# Patient Record
Sex: Male | Born: 1970
Health system: Western US, Academic
[De-identification: ages and names within clinical notes are randomized; demographics above are authoritative.]

## PROBLEM LIST (undated history)

## (undated) DIAGNOSIS — E271 Primary adrenocortical insufficiency: Secondary | ICD-10-CM

## (undated) DIAGNOSIS — N289 Disorder of kidney and ureter, unspecified: Secondary | ICD-10-CM

## (undated) HISTORY — PX: OTHER SURGICAL HISTORY: SHX169

## (undated) HISTORY — PX: HERNIA REPAIR: SHX51

## (undated) HISTORY — PX: CHOLECYSTECTOMY: SHX55

---

## 1999-06-06 ENCOUNTER — Emergency Department (HOSPITAL_COMMUNITY): Admission: EM | Admit: 1999-06-06 | Discharge: 1999-06-06 | Payer: Self-pay | Admitting: Emergency Medicine

## 2014-10-19 ENCOUNTER — Emergency Department: Payer: Self-pay | Admitting: Emergency Medicine

## 2014-12-26 ENCOUNTER — Emergency Department (HOSPITAL_BASED_OUTPATIENT_CLINIC_OR_DEPARTMENT_OTHER)
Admission: EM | Admit: 2014-12-26 | Discharge: 2014-12-27 | Disposition: A | Discharge status: Home / Self Care | Payer: Self-pay | Attending: Emergency Medicine | Admitting: Emergency Medicine

## 2014-12-26 ENCOUNTER — Encounter (HOSPITAL_BASED_OUTPATIENT_CLINIC_OR_DEPARTMENT_OTHER): Payer: Self-pay | Admitting: *Deleted

## 2014-12-26 DIAGNOSIS — Z720 Tobacco use: Secondary | ICD-10-CM | POA: Insufficient documentation

## 2014-12-26 DIAGNOSIS — R109 Unspecified abdominal pain: Secondary | ICD-10-CM

## 2014-12-26 DIAGNOSIS — Z8639 Personal history of other endocrine, nutritional and metabolic disease: Secondary | ICD-10-CM | POA: Insufficient documentation

## 2014-12-26 DIAGNOSIS — Z7952 Long term (current) use of systemic steroids: Secondary | ICD-10-CM | POA: Insufficient documentation

## 2014-12-26 DIAGNOSIS — Z9049 Acquired absence of other specified parts of digestive tract: Secondary | ICD-10-CM | POA: Insufficient documentation

## 2014-12-26 DIAGNOSIS — R1012 Left upper quadrant pain: Secondary | ICD-10-CM | POA: Insufficient documentation

## 2014-12-26 DIAGNOSIS — Z87448 Personal history of other diseases of urinary system: Secondary | ICD-10-CM | POA: Insufficient documentation

## 2014-12-26 DIAGNOSIS — R1032 Left lower quadrant pain: Secondary | ICD-10-CM | POA: Insufficient documentation

## 2014-12-26 HISTORY — DX: Disorder of kidney and ureter, unspecified: N28.9

## 2014-12-26 HISTORY — DX: Primary adrenocortical insufficiency: E27.1

## 2014-12-26 LAB — URINALYSIS, ROUTINE W REFLEX MICROSCOPIC
Bilirubin Urine: NEGATIVE
GLUCOSE, UA: NEGATIVE mg/dL
Ketones, ur: NEGATIVE mg/dL
Nitrite: NEGATIVE
Protein, ur: NEGATIVE mg/dL
Specific Gravity, Urine: 1.008 (ref 1.005–1.030)
UROBILINOGEN UA: 0.2 mg/dL (ref 0.0–1.0)
pH: 6 (ref 5.0–8.0)

## 2014-12-26 LAB — URINE MICROSCOPIC-ADD ON

## 2014-12-26 NOTE — ED Notes (Signed)
Pt was seen in [place] 2 days ago for flank and abd pain and dx with kidney stone, hernia and varicocele. Pt is on his way home to [place] and the pain is unbearable and pt has been vomiting.

## 2014-12-27 ENCOUNTER — Emergency Department (HOSPITAL_BASED_OUTPATIENT_CLINIC_OR_DEPARTMENT_OTHER): Payer: Self-pay

## 2014-12-27 LAB — CBC WITH DIFFERENTIAL/PLATELET
Basophils Absolute: 0.1 10*3/uL (ref 0.0–0.1)
Basophils Relative: 1 % (ref 0–1)
EOS ABS: 0.8 10*3/uL — AB (ref 0.0–0.7)
Eosinophils Relative: 9 % — ABNORMAL HIGH (ref 0–5)
HEMATOCRIT: 44.6 % (ref 39.0–52.0)
Hemoglobin: 16 g/dL (ref 13.0–17.0)
LYMPHS ABS: 1.9 10*3/uL (ref 0.7–4.0)
Lymphocytes Relative: 21 % (ref 12–46)
MCH: 32.9 pg (ref 26.0–34.0)
MCHC: 35.9 g/dL (ref 30.0–36.0)
MCV: 91.6 fL (ref 78.0–100.0)
MONO ABS: 0.7 10*3/uL (ref 0.1–1.0)
Monocytes Relative: 8 % (ref 3–12)
NEUTROS PCT: 61 % (ref 43–77)
Neutro Abs: 5.7 10*3/uL (ref 1.7–7.7)
Platelets: 235 10*3/uL (ref 150–400)
RBC: 4.87 MIL/uL (ref 4.22–5.81)
RDW: 11.9 % (ref 11.5–15.5)
WBC: 9.3 10*3/uL (ref 4.0–10.5)

## 2014-12-27 LAB — BASIC METABOLIC PANEL
Anion gap: 11 (ref 5–15)
BUN: 15 mg/dL (ref 6–20)
CALCIUM: 9.6 mg/dL (ref 8.9–10.3)
CO2: 25 mmol/L (ref 22–32)
Chloride: 104 mmol/L (ref 101–111)
Creatinine, Ser: 0.91 mg/dL (ref 0.61–1.24)
GFR calc Af Amer: >60 mL/min (ref >60)
Glucose, Bld: 91 mg/dL (ref 65–99)
POTASSIUM: 3.9 mmol/L (ref 3.5–5.1)
Sodium: 140 mmol/L (ref 135–145)

## 2014-12-27 MED ORDER — OXYCODONE-ACETAMINOPHEN 5-325 MG PO TABS
2.0000 | ORAL_TABLET | Freq: Once | ORAL | Status: AC
  Administered 2014-12-27: 2 via ORAL
  Filled 2014-12-27: qty 2

## 2014-12-27 NOTE — ED Provider Notes (Signed)
CSN: [id number]     Arrival date & time 12/26/14  2320 History  This chart was scribed for George MoMatthew Gentry, MD by George Mccoy, ED Scribe. This patient was seen in room MH10/MH10 and the patient's care was started 12:02 AM.     Chief Complaint  Patient presents with  . Groin Pain   Patient is a 44 y.o. male presenting with groin pain. The history is provided by the patient.  Groin Pain This is a recurrent problem. The current episode started more than 2 days ago. The problem occurs constantly. The problem has been gradually worsening. Associated symptoms include abdominal pain. Nothing aggravates the symptoms. The symptoms are relieved by medications. The treatment provided mild relief.   HPI Comments: George Mccoy is a 44 y.o. male who presents to the Emergency Department complaining of groin pain from his dx kidney stones 4 days ago. Pain complains of sharp, stabbing pain on his left flank that radiates to his testicle. Pt was seen in [place] 4 days ago for similar symptoms and was dx with kidney stone, hernia and varicocele. Pt reports that the medications he was prescribed (flomax, hydrocodone) has provided him some relief.  He states that he was on his way home today when he began vomiting due to pain.    Past Medical History  Diagnosis Date  . Renal disorder   . Addison's disease    Past Surgical History  Procedure Laterality Date  . Kidney stone stints    . Cholecystectomy    . Hernia repair     No family history on file. History  Substance Use Topics  . Smoking status: Current Every Day Smoker  . Smokeless tobacco: Not on file  . Alcohol Use: No    Review of Systems  Gastrointestinal: Positive for abdominal pain.  Genitourinary: Positive for flank pain and testicular pain.  All other systems reviewed and are negative.     Allergies  Erythromycin; Nsaids; and Morphine and related  Home Medications   Prior to Admission medications   Medication Sig Start  Date End Date Taking? Authorizing Provider  HYDROcodone-acetaminophen (NORCO/VICODIN) 5-325 MG per tablet Take 1 tablet by mouth every 6 (six) hours as needed for moderate pain.   Yes Historical Provider, MD  hydrocortisone (CORTEF) 20 MG tablet Take 20 mg by mouth daily.   Yes Historical Provider, MD  tamsulosin (FLOMAX) 0.4 MG CAPS capsule Take 0.4 mg by mouth.   Yes Historical Provider, MD   BP 148/76 mmHg  Pulse 88  Temp(Src) 98.3 F (36.8 C) (Oral)  Resp 18  Ht 6\' 2"  (1.88 m)  Wt 215 lb (97.523 kg)  BMI 27.59 kg/m2  SpO2 100% Physical Exam  Constitutional: He is oriented to person, place, and time. He appears well-developed and well-nourished.  HENT:  Head: Normocephalic and atraumatic.  Eyes: Conjunctivae and EOM are normal.  Neck: Normal range of motion. Neck supple.  Cardiovascular: Normal rate, regular rhythm and normal heart sounds.   Pulmonary/Chest: Effort normal and breath sounds normal. No respiratory distress.  Abdominal: He exhibits no distension. There is tenderness in the left upper quadrant and left lower quadrant. There is no rebound and no guarding.  Musculoskeletal: Normal range of motion.  Neurological: He is alert and oriented to person, place, and time.  Skin: Skin is warm and dry.  Vitals reviewed.   ED Course  Procedures  DIAGNOSTIC STUDIES: Oxygen Saturation is 98% on RA, normal by my interpretation.    COORDINATION OF CARE:  12:05 AM Discussed treatment plan with pt at bedside and pt agreed to plan.   Labs Review Labs Reviewed  URINALYSIS, ROUTINE W REFLEX MICROSCOPIC (NOT AT [facility]) - Abnormal; Notable for the following:    APPearance CLOUDY (*)    Hgb urine dipstick LARGE (*)    Leukocytes, UA SMALL (*)    All other components within normal limits  URINE MICROSCOPIC-ADD ON - Abnormal; Notable for the following:    Squamous Epithelial / LPF FEW (*)    All other components within normal limits  CBC WITH DIFFERENTIAL/PLATELET - Abnormal;  Notable for the following:    Eosinophils Relative 9 (*)    Eosinophils Absolute 0.8 (*)    All other components within normal limits  BASIC METABOLIC PANEL    Imaging Review US Renal  12/27/2014   CLINICAL DATA:  Severe left lower back pain. History of kidney stones.  EXAM: RENAL / URINARY TRACT ULTRASOUND COMPLETE  COMPARISON:  None.  FINDINGS: Right Kidney:  Normal cortical thickness, echogenicity and size, measuring 12.8 cm in length. No focal renal lesions. No echogenic renal stones. No urinary obstruction.  Left Kidney:  Normal cortical thickness, echogenicity and size, measuring 13.1 cm in length. No focal renal lesions. Note is made of a approximately 0.5 cm echogenic stone within the inferior pole the left kidney (image 17) as well as an approximately 0.4 cm stone also within the inferior pole the left kidney (image 18). No urinary obstruction.  Bladder:  Appears normal for degree of bladder distention. Bilateral ureteral jets are identified  IMPRESSION: 1. Suspected left-sided nephrolithiasis without evidence of urinary obstruction. Further evaluation with noncontrast abdominal CT could be performed as clinically indicated. 2. No evidence of urinary obstruction   Electronically Signed   By: Simonne Come M.D.   On: 12/27/2014 01:08     EKG Interpretation None      MDM   Final diagnoses:  Left flank pain    44 y.o. male with pertinent PMH of frequent ED visits to multiple providers in different states and across this state with suspicion of drug seeking (I verified this across [facility] provider database and with review of records through care everywhere), as well as verified nephrolithiasis presents with recurrent L flank pain.  No systemic symptoms with exception of nausea.  Physical exam today as above, pt well appearing with reassuring vitals.  Wu with hematuria, obtained US due to reported history of prior stenting.  [place] without hydronephrosis.    I discussed the pt's history and he was  very pleasant.  I discussed that I could not prescribe him medication due to his history and encouraged him to fu with his PCP and urology, as well as with his endocrinologist to see if he could receive intermittent toradol.  He voiced understanding of precautions and agreed to fu.  DC home in stable condition.    I have reviewed all laboratory and imaging studies if ordered as above  1. Left flank pain           George Mo, MD 12/27/14 [phone]

## 2014-12-27 NOTE — Discharge Instructions (Signed)
Flank Pain °Flank pain refers to pain that is located on the side of the body between the upper abdomen and the back. The pain may occur over a short period of time (acute) or may be long-term or reoccurring (chronic). It may be mild or severe. Flank pain can be caused by many things. °CAUSES  °Some of the more common causes of flank pain include: °· Muscle strains.   °· Muscle spasms.   °· A disease of your spine (vertebral disk disease).   °· A lung infection (pneumonia).   °· Fluid around your lungs (pulmonary edema).   °· A kidney infection.   °· Kidney stones.   °· A very painful skin rash caused by the chickenpox virus (shingles).   °· Gallbladder disease.   °HOME CARE INSTRUCTIONS  °Home care will depend on the cause of your pain. In general, °· Rest as directed by your caregiver. °· Drink enough fluids to keep your urine clear or pale yellow. °· Only take over-the-counter or prescription medicines as directed by your caregiver. Some medicines may help relieve the pain. °· Tell your caregiver about any changes in your pain. °· Follow up with your caregiver as directed. °SEEK IMMEDIATE MEDICAL CARE IF:  °· Your pain is not controlled with medicine.   °· You have new or worsening symptoms. °· Your pain increases.   °· You have abdominal pain.   °· You have shortness of breath.   °· You have persistent nausea or vomiting.   °· You have swelling in your abdomen.   °· You feel faint or pass out.   °· You have blood in your urine. °· You have a fever or persistent symptoms for more than 2-3 days. °· You have a fever and your symptoms suddenly get worse. °MAKE SURE YOU:  °· Understand these instructions. °· Will watch your condition. °· Will get help right away if you are not doing well or get worse. °Document Released: 09/10/2005 Document Revised: 04/13/2012 Document Reviewed: 03/03/2012 °ExitCare® Patient Information ©2015 [employer]. This information is not intended to replace advice given to you by your  health care provider. Make sure you discuss any questions you have with your health care provider. ° °Kidney Stones °Kidney stones (urolithiasis) are deposits that form inside your kidneys. The intense pain is caused by the stone moving through the urinary tract. When the stone moves, the ureter goes into spasm around the stone. The stone is usually passed in the urine.  °CAUSES  °· A disorder that makes certain neck glands produce too much parathyroid hormone (primary hyperparathyroidism). °· A buildup of uric acid crystals, similar to gout in your joints. °· Narrowing (stricture) of the ureter. °· A kidney obstruction present at birth (congenital obstruction). °· Previous surgery on the kidney or ureters. °· Numerous kidney infections. °SYMPTOMS  °· Feeling sick to your stomach (nauseous). °· Throwing up (vomiting). °· Blood in the urine (hematuria). °· Pain that usually spreads (radiates) to the groin. °· Frequency or urgency of urination. °DIAGNOSIS  °· Taking a history and physical exam. °· Blood or urine tests. °· CT scan. °· Occasionally, an examination of the inside of the urinary bladder (cystoscopy) is performed. °TREATMENT  °· Observation. °· Increasing your fluid intake. °· Extracorporeal shock wave lithotripsy--This is a noninvasive procedure that uses shock waves to break up kidney stones. °· Surgery may be needed if you have severe pain or persistent obstruction. There are various surgical procedures. Most of the procedures are performed with the use of small instruments. Only small incisions are needed to accommodate these instruments,   so recovery time is minimized. °The size, location, and chemical composition are all important variables that will determine the proper choice of action for you. Talk to your health care provider to better understand your situation so that you will minimize the risk of injury to yourself and your kidney.  °HOME CARE INSTRUCTIONS  °· Drink enough water and fluids to  keep your urine clear or pale yellow. This will help you to pass the stone or stone fragments. °· Strain all urine through the provided strainer. Keep all particulate matter and stones for your health care provider to see. The stone causing the pain may be as small as a grain of salt. It is very important to use the strainer each and every time you pass your urine. The collection of your stone will allow your health care provider to analyze it and verify that a stone has actually passed. The stone analysis will often identify what you can do to reduce the incidence of recurrences. °· Only take over-the-counter or prescription medicines for pain, discomfort, or fever as directed by your health care provider. °· Make a follow-up appointment with your health care provider as directed. °· Get follow-up X-rays if required. The absence of pain does not always mean that the stone has passed. It may have only stopped moving. If the urine remains completely obstructed, it can cause loss of kidney function or even complete destruction of the kidney. It is your responsibility to make sure X-rays and follow-ups are completed. Ultrasounds of the kidney can show blockages and the status of the kidney. Ultrasounds are not associated with any radiation and can be performed easily in a matter of minutes. °SEEK MEDICAL CARE IF: °· You experience pain that is progressive and unresponsive to any pain medicine you have been prescribed. °SEEK IMMEDIATE MEDICAL CARE IF:  °· Pain cannot be controlled with the prescribed medicine. °· You have a fever or shaking chills. °· The severity or intensity of pain increases over 18 hours and is not relieved by pain medicine. °· You develop a new onset of abdominal pain. °· You feel faint or pass out. °· You are unable to urinate. °MAKE SURE YOU:  °· Understand these instructions. °· Will watch your condition. °· Will get help right away if you are not doing well or get worse. °Document Released:  07/20/2005 Document Revised: 03/22/2013 Document Reviewed: 12/21/2012 °ExitCare® Patient Information ©2015 [employer]. This information is not intended to replace advice given to you by your health care provider. Make sure you discuss any questions you have with your health care provider. ° °

## 2014-12-27 NOTE — ED Notes (Signed)
Patient transported to Ultrasound 

## 2016-03-09 IMAGING — CT CT ABD-PELV W/O CM
2 of 4 series · 16 of 46 positions shown, 18 images · non-contrast
Comparison: None

CLINICAL DATA: Right-sided flank pain

EXAM:
CT ABDOMEN AND PELVIS WITHOUT CONTRAST
TECHNIQUE: Multidetector CT imaging of the abdomen and pelvis was performed
following the standard protocol without IV contrast.

[Series 2: stone standard full · axial · 0.81mm/px · z∈[-1140,-614]mm · 13 of 115 slices shown, 15 images]
[im 5/115  soft-tissue]
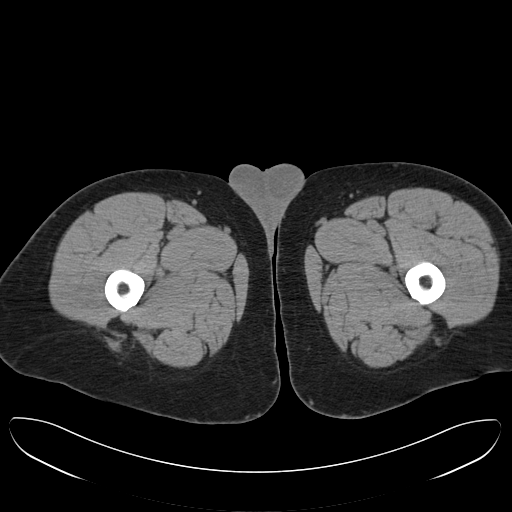
[im 5/115  bone]
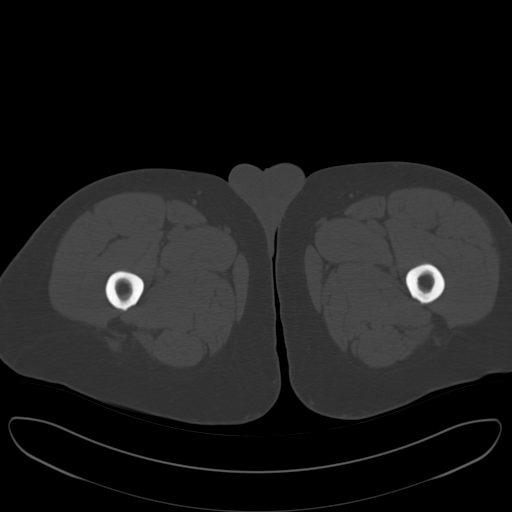
[im 14/115  soft-tissue]
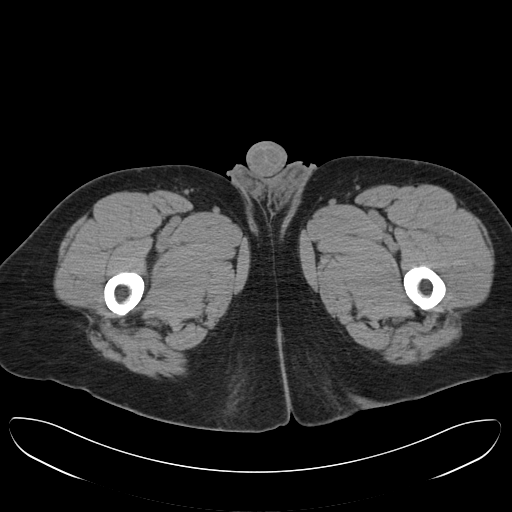
[im 22/115  soft-tissue]
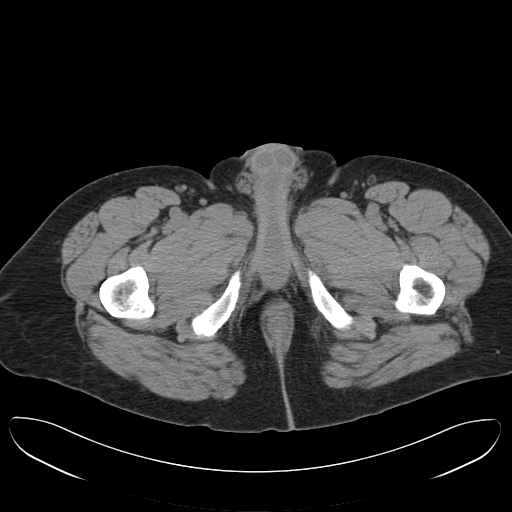
[im 31/115  soft-tissue]
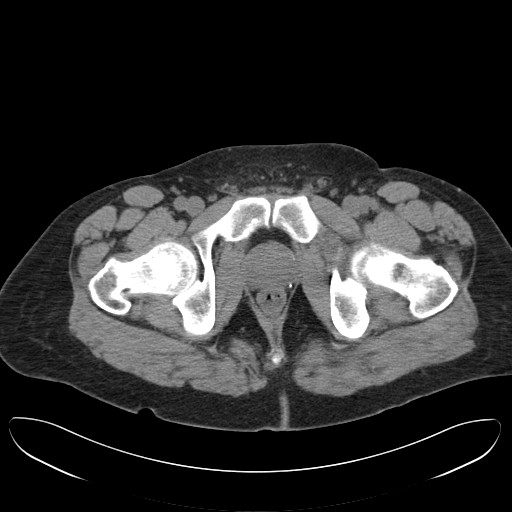
[im 40/115  soft-tissue]
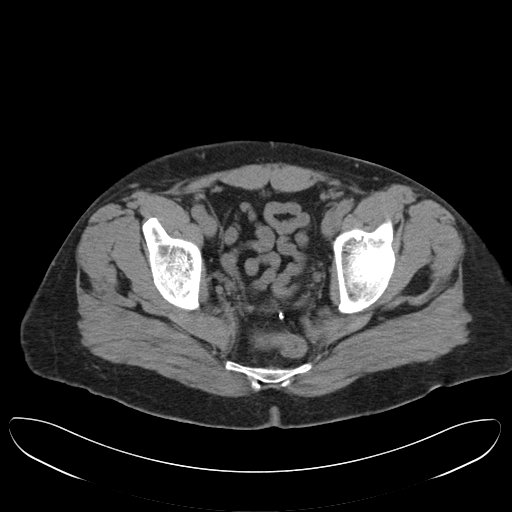
[im 49/115  soft-tissue]
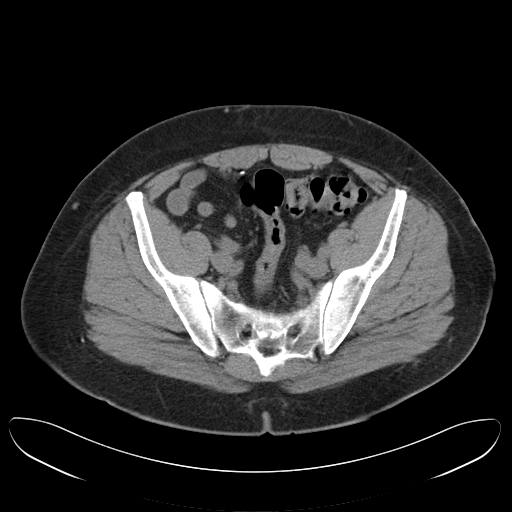
[im 58/115  soft-tissue]
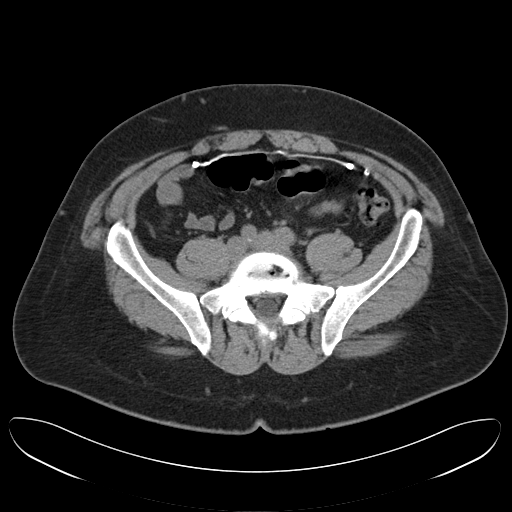
[im 66/115  soft-tissue]
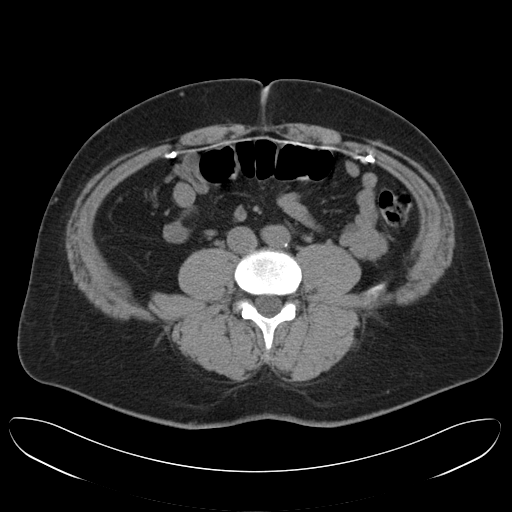
[im 75/115  soft-tissue]
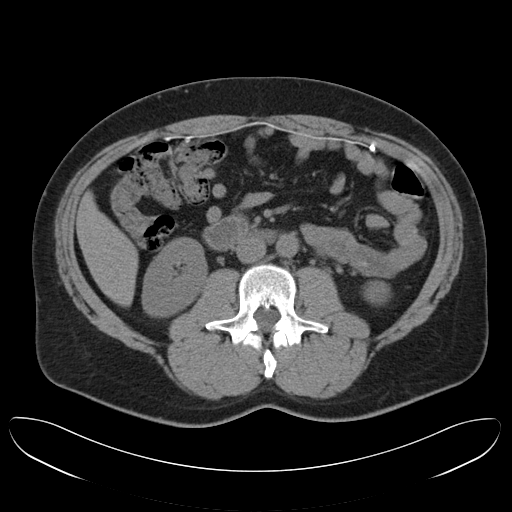
[im 75/115  bone]
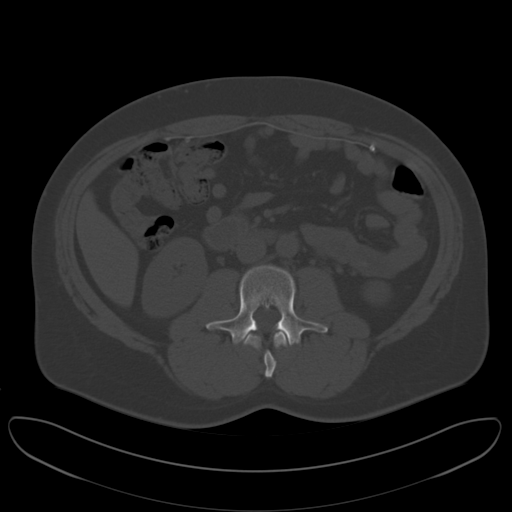
[im 84/115  soft-tissue]
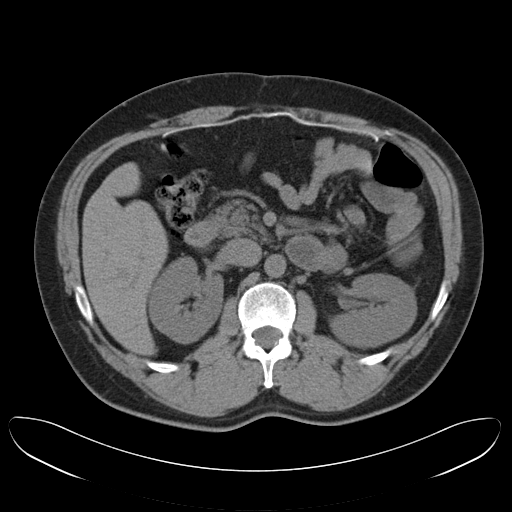
[im 93/115  soft-tissue]
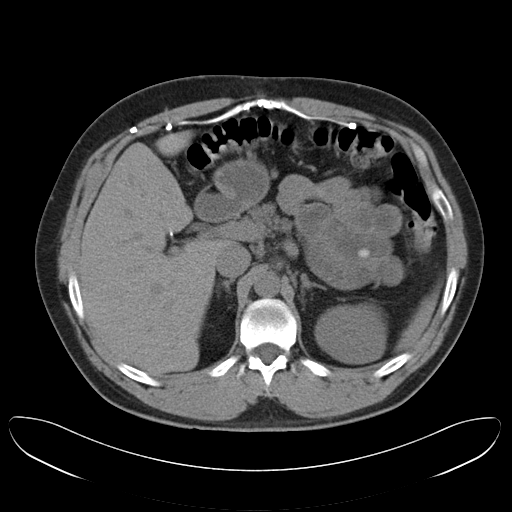
[im 101/115  soft-tissue]
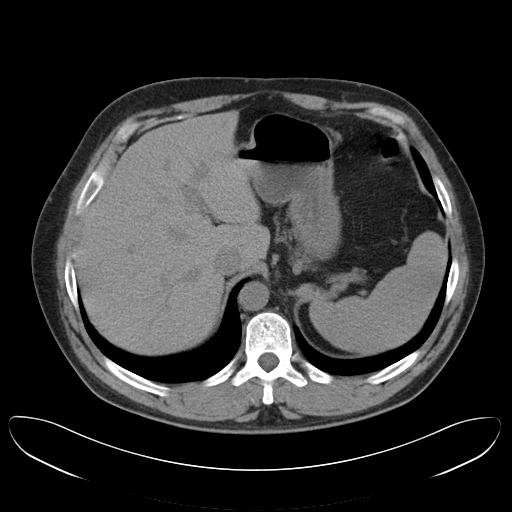
[im 110/115  soft-tissue]
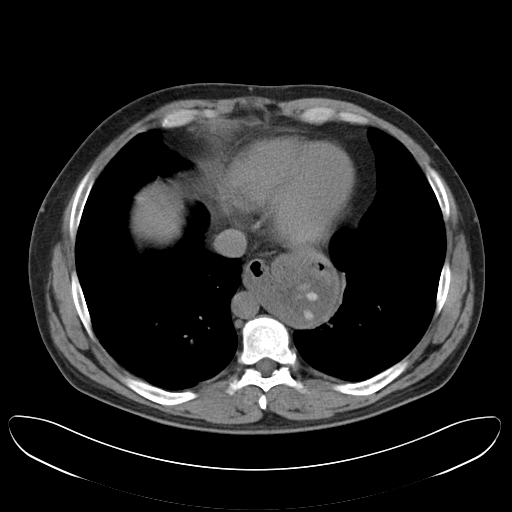

[Series 5: cor stone standard full · coronal · 0.83mm/px · 3 of 134 slices shown]
[im 45/134  soft-tissue]
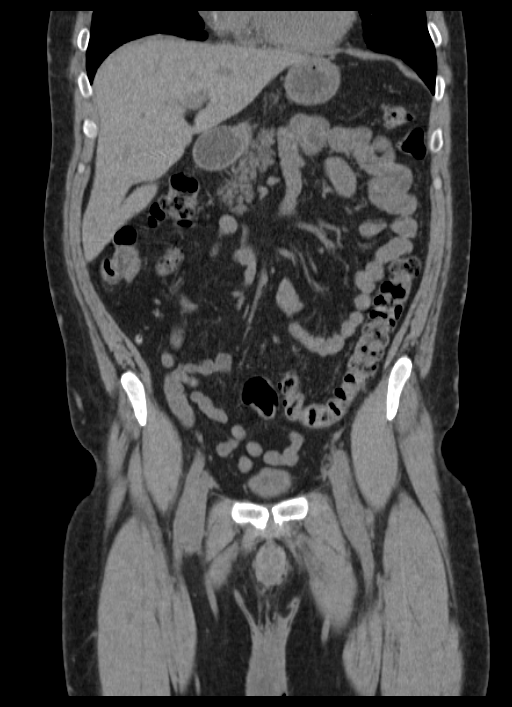
[im 60/134  soft-tissue]
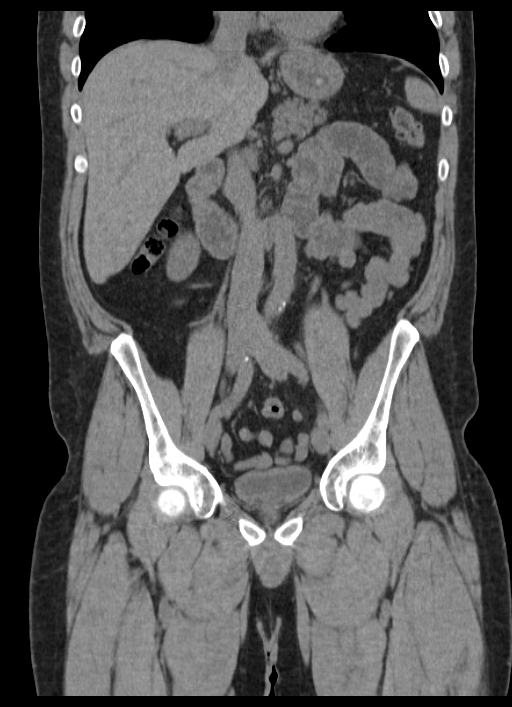
[im 74/134  soft-tissue]
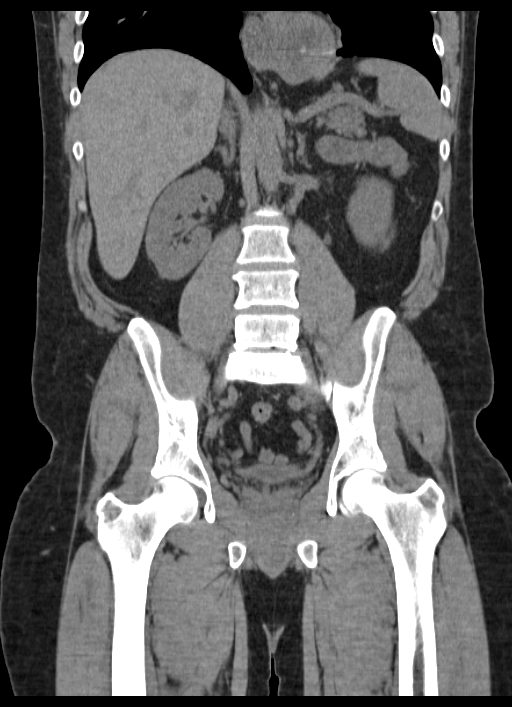

[16 of 46 positions shown; findings below may reference images not displayed]

FINDINGS: There are 2 mm calculi in the renal collecting systems bilaterally.
No ureteral calculi are evident. The right collecting system and
ureter appear normal. There is mild periureteral stranding on the
left, with no evidence of a calculus.There is a large hiatal hernia.
There are unremarkable unenhanced appearances of the liver, spleen,
pancreas and adrenals. There is prior cholecystectomy. There is
prior ventral herniorrhaphy with mesh. Bowel appears unremarkable.
No significant abnormalities are evident in the lower chest.
IMPRESSION: 1. Bilateral nephrolithiasis
2. Mild periureteral stranding on the left. No ureteral calculi are
evident. This could represent recent left sided stone passage.
Pyelonephritis can sometimes produce this appearance. Right
collecting system and ureter appear normal.
3. Large hiatal hernia

## 2016-05-16 IMAGING — US US RENAL
1 series · 14 of 25 positions shown · non-contrast
Comparison: None.

CLINICAL DATA: Severe left lower back pain. History of kidney
stones.

EXAM:
RENAL / URINARY TRACT ULTRASOUND COMPLETE

[Series 1: us renal · 0.22mm/px · 14 of 32 slices shown]
[im 1/32]
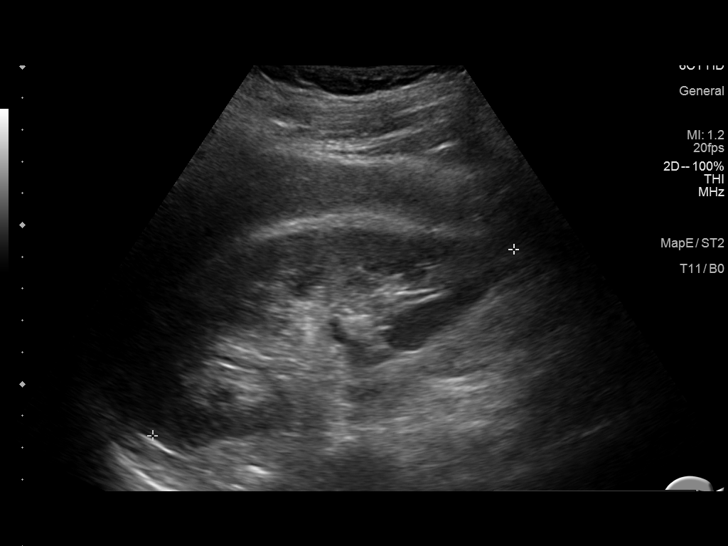
[im 3/32]
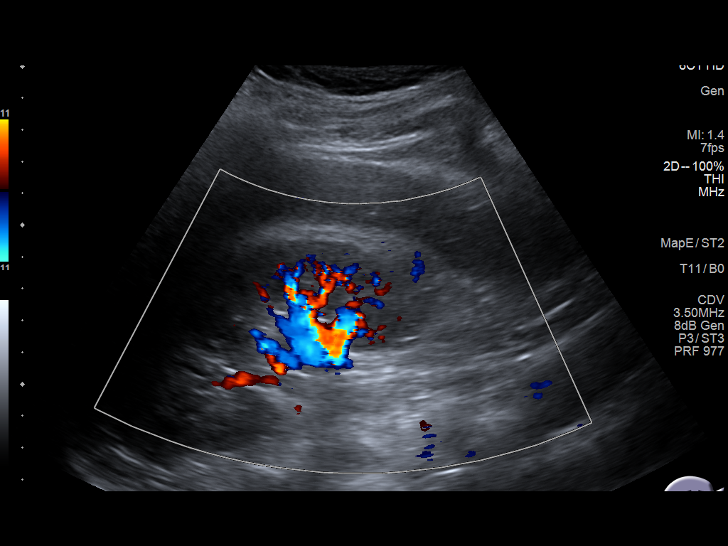
[im 6/32]
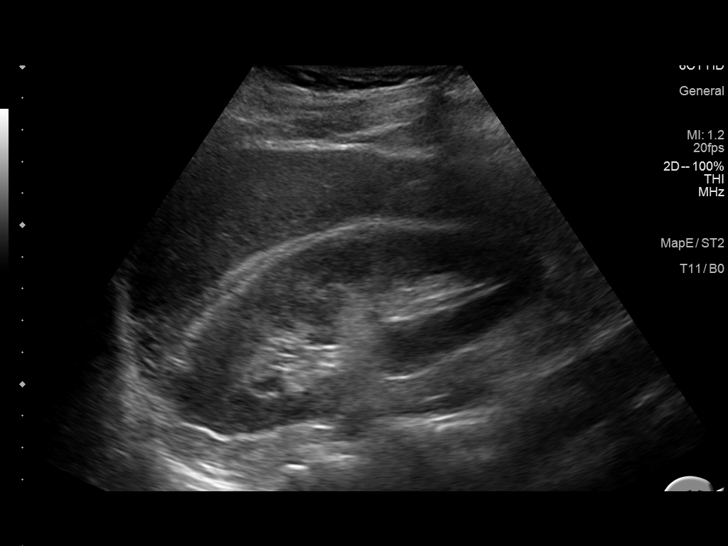
[im 8/32]
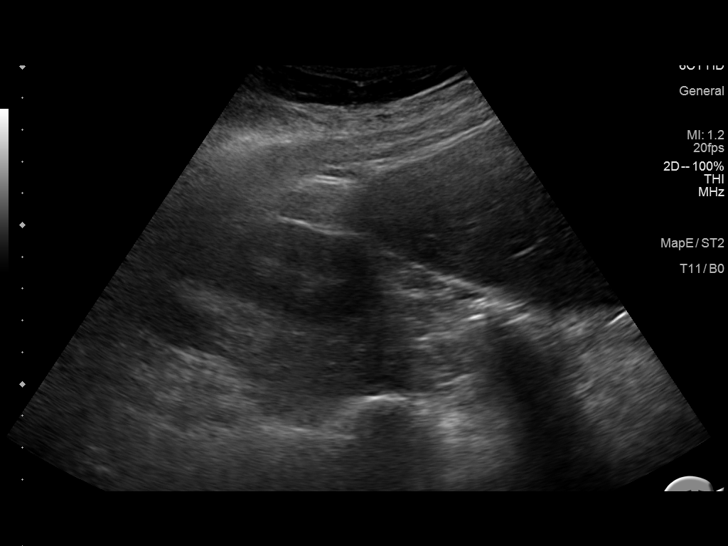
[im 11/32]
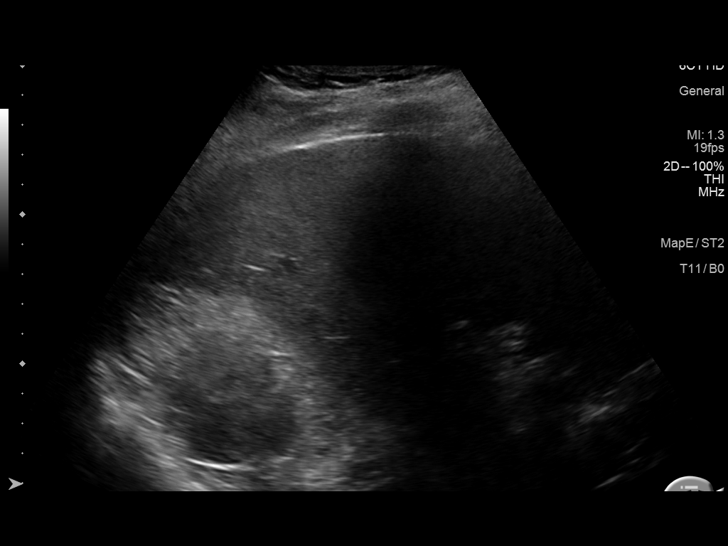
[im 12/32]
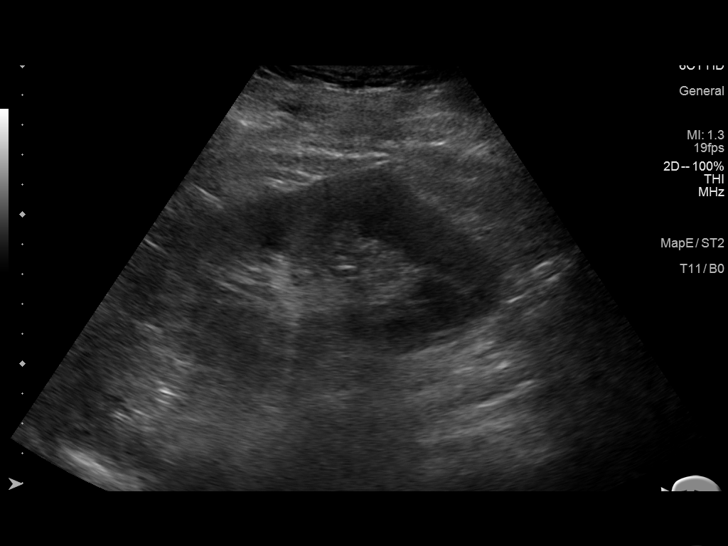
[im 15/32]
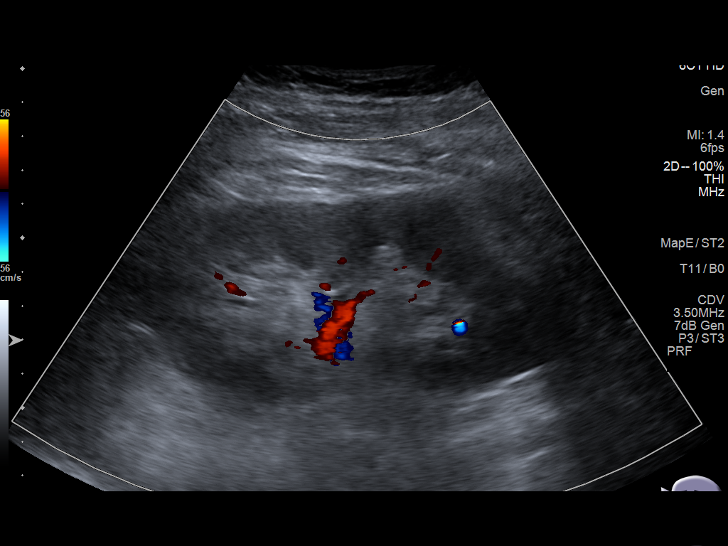
[im 17/32]
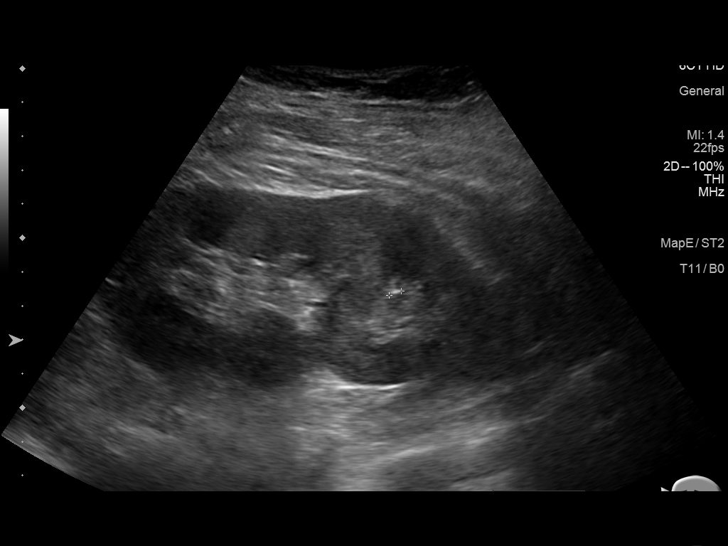
[im 20/32]
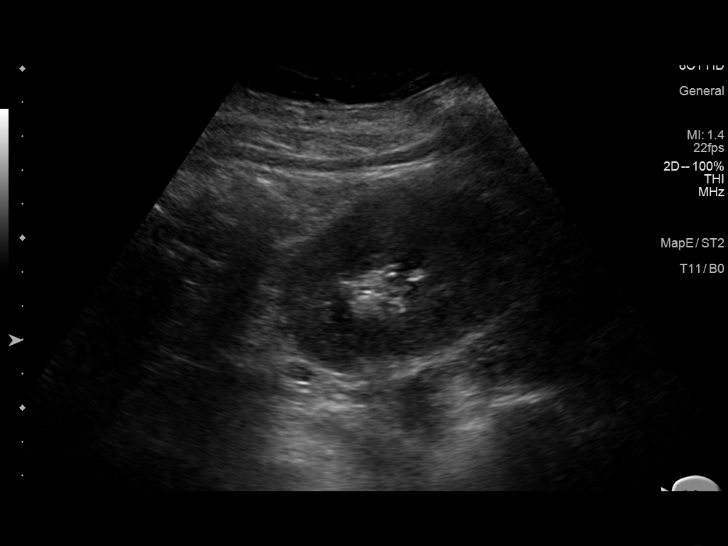
[im 21/32]
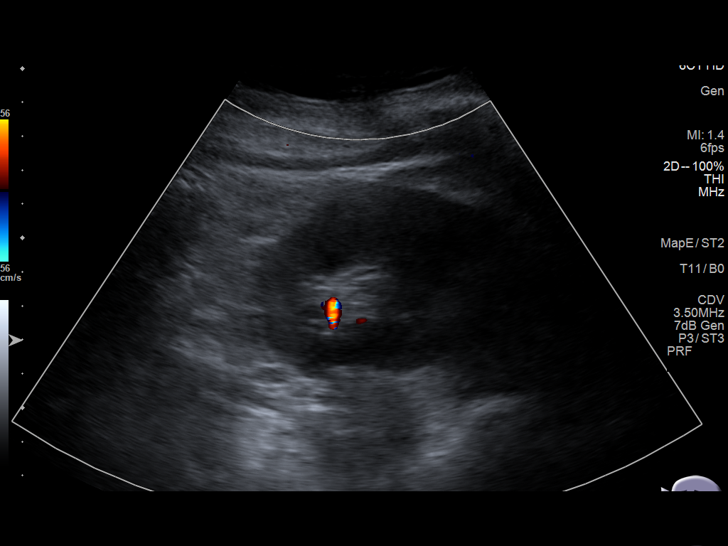
[im 24/32]
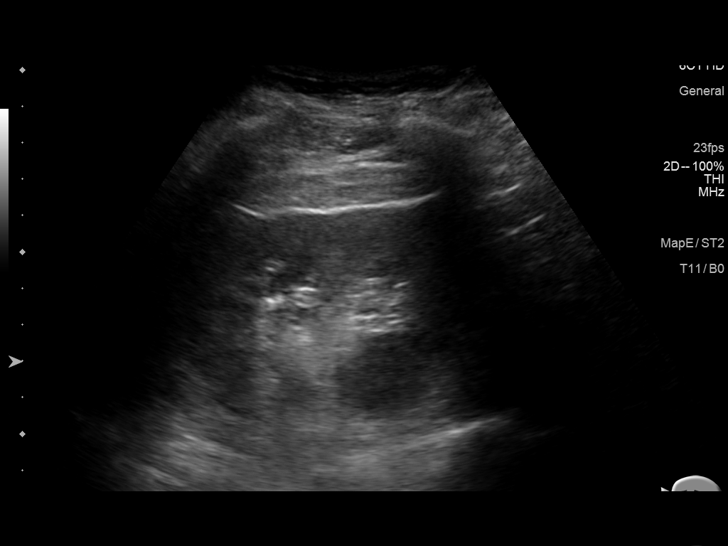
[im 26/32]
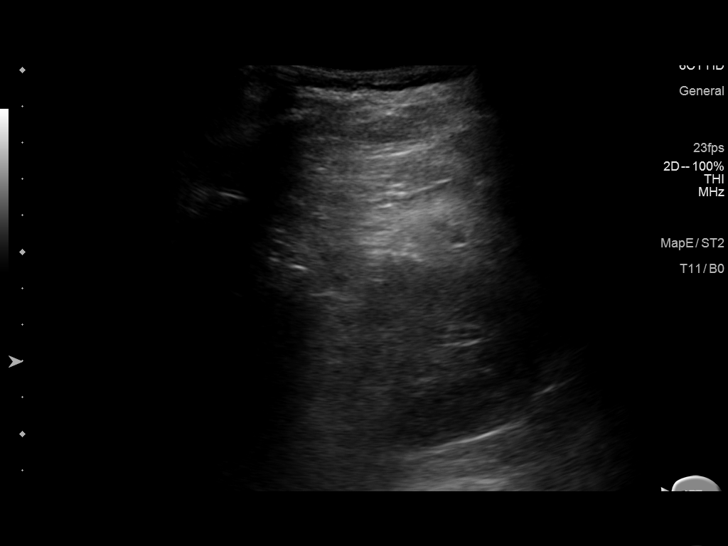
[im 29/32]
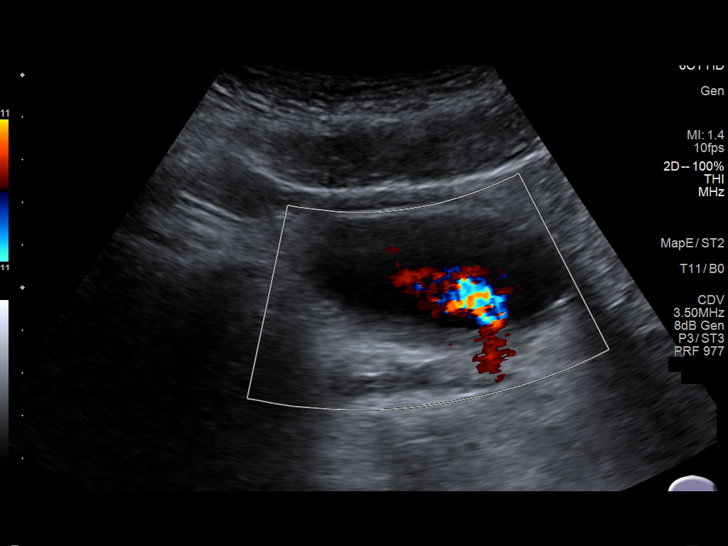
[im 32/32]
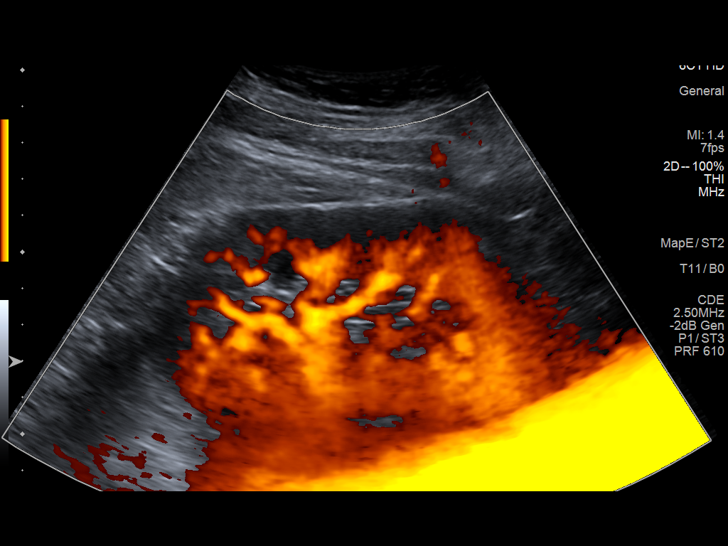

[14 of 25 positions shown; findings below may reference images not displayed]

FINDINGS: Right Kidney:

Normal cortical thickness, echogenicity and size, measuring 12.8 cm
in length. No focal renal lesions. No echogenic renal stones. No
urinary obstruction.

Left Kidney:

Normal cortical thickness, echogenicity and size, measuring 13.1 cm
in length. No focal renal lesions. Note is made of a approximately
0.5 cm echogenic stone within the inferior pole the left kidney
(image 17) as well as an approximately 0.4 cm stone also within the
inferior pole the left kidney (image 18). No urinary obstruction.

Bladder:

Appears normal for degree of bladder distention. Bilateral ureteral
jets are identified
IMPRESSION: 1. Suspected left-sided nephrolithiasis without evidence of urinary
obstruction. Further evaluation with noncontrast abdominal CT could
be performed as clinically indicated.
2. No evidence of urinary obstruction

## 2018-02-18 ENCOUNTER — Inpatient Hospital Stay
Admit: 2018-02-18 | Discharge: 2018-02-19 | Disposition: A | Discharge status: Home / Self Care | Source: Home / Self Care

## 2018-02-18 ENCOUNTER — Ambulatory Visit

## 2018-02-18 DIAGNOSIS — R0789 Other chest pain: Secondary | ICD-10-CM

## 2018-02-18 NOTE — ED Notes
Called to room pt in ED, but no answer X1 in triage and no answer in parking lot; will attempt again

## 2018-02-18 NOTE — ED Provider Notes
[facility]  Emergency Department Service Report    Kenneth Burns 47 y.o. male , presents with Chest Pain      Triage   Arrived on 02/18/2018 at 2:17 PM   Arrived by Walk-in [[id number]]    ED Triage Vitals   Temp Temp Source BP Heart Rate Resp SpO2 O2 Device Pain Score Weight   02/18/18 1427 02/18/18 1427 02/18/18 1423 02/18/18 1423 02/18/18 1423 02/18/18 1423 02/18/18 1423 02/18/18 1423 02/18/18 1423   36.7 ???C (98 ???F) Oral 150/88 86 18 98 % None (Room air) Five 99.8 kg (220 lb)       Pre hospital care:       Allergies   Allergen Reactions   ??? Erythromycin Anaphylaxis       History   HPI       Kenneth Burns is a 47 yo M with a pmhx sig for HTN who presents for chest pain x 3 days.  The pain comes and goes without exacerbating factors.  The pain is sharp and stabbing with aching in his L arm.  No shortness of breath.  The episodes of pain last for 5 minutes.  No fevers or cough. No history of DVT or PE in him or family.  No recent travel.  No recent surgeries.  Kenneth Burns is a smoker.  Kenneth Burns currently is supposed to take lisinopril but Kenneth Burns is currently out of it.  Kenneth Burns has had numerous deaths in his family in their 56s due to heart attacks.  Kenneth Burns had a stress test within the last year at [facility] that was reportedly normal.  Kenneth Burns took an ASA yday.    Past Medical History:   Diagnosis Date   ??? Kidney stone         Past Surgical History:   Procedure Laterality Date   ??? HERNIA REPAIR          Past Family History   family history is not on file.     Past Social History   Kenneth Burns reports that Kenneth Burns has been smoking Cigarettes.  Kenneth Burns has been smoking about 1.00 pack per day. Kenneth Burns has never used smokeless tobacco. Kenneth Burns reports that Kenneth Burns drinks alcohol. Kenneth Burns reports that Kenneth Burns currently engages in sexual activity and has had male partners. Kenneth Burns reports that Kenneth Burns does not use drugs.     Review of Systems   Constitutional: Negative for fever.   HENT: Negative for congestion.    Respiratory: Negative for shortness of breath. Cardiovascular: Positive for chest pain.   Gastrointestinal: Negative for abdominal pain, diarrhea and vomiting.   Genitourinary: Negative for dysuria.   Musculoskeletal: Negative for back pain.   Skin: Negative for rash.   Neurological: Negative for syncope.   Hematological: Does not bruise/bleed easily.   Psychiatric/Behavioral: Negative for agitation.       Physical Exam   Physical Exam   Constitutional: Kenneth Burns is oriented to person, place, and time. Kenneth Burns appears well-developed and well-nourished.   HENT:   Head: Normocephalic and atraumatic.   Eyes: Pupils are equal, round, and reactive to light. Conjunctivae are normal.   Neck: Normal range of motion. Neck supple.   Cardiovascular: Normal rate and regular rhythm.    Pulmonary/Chest: Effort normal and breath sounds normal.   Abdominal: Soft. There is no tenderness.   Musculoskeletal: Normal range of motion. Kenneth Burns exhibits no edema.   Neurological: Kenneth Burns is alert and oriented to person, place, and time.   Skin: Skin is warm and dry.  Psychiatric: Kenneth Burns has a normal mood and affect.       ED Course          Laboratory Results     Labs Reviewed   CBC - Abnormal; Notable for the following:        Result Value    Red Blood Cell Count 4.27 (*)     Mean Corpuscular Hemoglobin 33.5 (*)     MCH Concentration 36.1 (*)     All other components within normal limits   BASIC METABOLIC PANEL - Abnormal; Notable for the following:     Total CO2 32 (*)     Anion Gap 6 (*)     Glucose 133 (*)     All other components within normal limits   D-DIMER - Normal    Narrative:     Note:  Quantitative method to detect D-Dimer.  Please note reference intervals may help discriminate single thrombotic events, e.g. DVT/PE from disseminated intravascular coagulation.    Reference Interval:        <500 ng/mL:  Negative    [phone] ng/mL:  Possible thrombotic event      >=2000 ng/mL:  DIC usually >=2000ng/mL   TROPONIN I   TROPONIN I   EXTRA TUBES    Narrative: The following orders were created for panel order Extra Tubes.  Procedure                               Abnormality         Status                     ---------                               -----------         ------                     Extra Doylene Canning [facility][[id number]]                                   Final result                 Please view results for these tests on the individual orders.   EXTRA GOLD TOP       Imaging Results     XR chest pa (1 view)   Final Result by Dalbert Batman., MD (07/19 1835)   IMPRESSION:        No acute cardiopulmonary disease.   Moderate hiatal hernia.      Signed by: Quentin Cornwall   02/18/2018 6:35 PM          Administered Medications     Medication Administration from 02/18/2018 1417 to 02/21/2018 1051       Date/Time Order Dose Route Action Action by Comments     02/18/2018 1750 aspirin tab 325 mg 325 mg Oral Given Marcellina Millin, RN           Procedures   Procedures    MDM  Kenneth Burns is a 47 yo M with chest pain and concerning family history.  However Kenneth Burns did have a stress test within the last year that is normal.  Doubt PE given no risk factors, not tachy, not hypoxia.   Will get  d-dimer given extensive smoking history.  Doubt dissection given atypical nature of pain.  Will get trop/ekg/cxr and reassess.     EKG wnl.  Trop negative.  Labs wnl.  CXR wnl.  Instructed Kenneth Burns to f/u with his PMD for further outpatient testing if warranted.  Return precautions provided.  Kenneth Burns well appearing.    Clinical Impression     1. Other chest pain        Prescriptions     Discharge Medication List as of 02/18/2018  8:12 PM          Disposition and Follow-up   Disposition: discharge    No future appointments.    Follow up with:  Clinic, Lavina Hamman, MD  [address]  [place] [address]  [phone]    In 2 days        Return precautions are specified on After Visit Summary.                 Cleotis Lema., MD  02/21/18 1051

## 2018-02-19 LAB — Troponin I
TROPONIN I: <0.04 ng/mL (ref <0.1)
TROPONIN INTERPRETATION: NEGATIVE ng/mL (ref <0.1)

## 2018-02-19 LAB — CBC: ABSOLUTE NUCLEATED RBC COUNT: 0 10*3/uL (ref 0.00–0.00)

## 2018-02-19 LAB — Basic Metabolic Panel
CALCIUM: 9.5 mg/dL (ref 8.6–10.4)
POTASSIUM: 4.4 mmol/L (ref 3.6–5.3)

## 2018-02-19 LAB — D-Dimer: D-DIMER: 414 ng{FEU}/mL (ref <=499)

## 2018-02-19 LAB — Extra Gold Top

## 2018-02-19 MED ADMIN — ASPIRIN 325 MG PO TABS: 325 mg | ORAL | @ 01:00:00 | Stop: 2018-02-19 | NDC 66553000101

## 2018-02-19 NOTE — ED Notes
Report is given to Brenda, RN

## 2018-02-19 NOTE — ED Notes
Patient left his room immediately after the provider saw and talk to him; didn't wait for his paperworks; just left the room; VS were not taken prior to DC since he is no longer in his room; no IV inserted during his stay.

## 2018-02-19 NOTE — ED Notes
Pt is taken to radiology for a chest XR.

## 2018-02-19 NOTE — ED Notes
Patient received awake,alert oriented not in distress; hooked to the cardiac monitors; stated he feels much better; VS WNL; stable.

## 2018-04-23 DIAGNOSIS — J0110 Acute frontal sinusitis, unspecified: Secondary | ICD-10-CM

## 2018-04-24 ENCOUNTER — Inpatient Hospital Stay
Admit: 2018-04-24 | Discharge: 2018-04-24 | Disposition: A | Discharge status: Home / Self Care | Source: Home / Self Care

## 2018-04-24 MED ORDER — IBUPROFEN 600 MG PO TABS
600 mg | ORAL_TABLET | Freq: Four times a day (QID) | ORAL | 0 refills | Status: AC | PRN
Start: 2018-04-24 — End: ?

## 2018-04-24 MED ORDER — AMOXICILLIN-POT CLAVULANATE 875-125 MG PO TABS
1 | ORAL_TABLET | Freq: Two times a day (BID) | ORAL | 0 refills | Status: AC
Start: 2018-04-24 — End: ?

## 2018-04-24 NOTE — ED Provider Notes
[facility]  Emergency Department Service Report    Kenneth Burns 47 y.o. male , presents with Cough and Sinusitis      Triage   Arrived on 04/23/2018 at 5:19 PM   Arrived by Car [5]    ED Triage Vitals [04/23/18 1723]   Temp Temp Source BP Heart Rate Resp SpO2 O2 Device Pain Score Weight   36.9 ???C (98.5 ???F) Oral 158/82 88 20 100 % None (Room air) Seven (!) 111.1 kg (245 lb)       Pre hospital care:       Allergies   Allergen Reactions   ??? Erythromycin Anaphylaxis   ??? Morphine Rash       History   Patient is a 47 y.o. male with hx of kidney stones who presents to the ED with complaint of gradual onset productive cough and congestion for the last 2 weeks. Sx are mild, constant, gradually worsening, no alleviating factors, worse in the morning. Associated sx include frontal sinus pain, worse with bending over. Denies fever, chills, nausea, vomiting. Allergic to erythromycin and morphine.           The history is provided by the patient. No language interpreter was used.   Cough   This is a new problem. The current episode started more than 1 week ago. The problem occurs constantly. The problem has been gradually worsening. The cough is productive of sputum. There has been no fever. Associated symptoms include rhinorrhea. Pertinent negatives include no chills. He has tried nothing for the symptoms. The treatment provided no relief. He is a smoker.            Past Medical History:   Diagnosis Date   ??? Kidney stone         Past Surgical History:   Procedure Laterality Date   ??? HERNIA REPAIR          Past Family History   Family history reviewed by me and there is no pertinent past family history related to patient's current case and/or care.      Past Social History   he reports that he has been smoking Cigarettes.  He has been smoking about 1.00 pack per day. He has never used smokeless tobacco. He reports that he drinks alcohol. He reports that he currently engages in sexual activity and has had male partners. He reports that he does not use drugs.     Review of Systems   Constitutional: Negative for chills and fever.   HENT: Positive for congestion, rhinorrhea and sinus pain.    Respiratory: Positive for cough.    Gastrointestinal: Negative for nausea and vomiting.   All other systems reviewed and are negative.      Physical Exam   Physical Exam   Constitutional: He appears well-developed and well-nourished.   HENT:   Head: Normocephalic and atraumatic.   Mouth/Throat: Oropharynx is clear and moist.   Right frontal sinus tenderness.    Eyes: Pupils are equal, round, and reactive to light. Conjunctivae are normal.   Neck: Normal range of motion. Neck supple.   Cardiovascular: Normal rate, regular rhythm and normal heart sounds.    Pulmonary/Chest: Effort normal and breath sounds normal.   Lungs are clear BL.    Abdominal: Soft. Bowel sounds are normal. There is no tenderness.   Neurological: He is alert.   Skin: Skin is warm and dry.   Psychiatric: He has a normal mood and affect. His behavior is  normal.   Nursing note and vitals reviewed.      ED Course          Laboratory Results   Labs Reviewed - No data to display    Imaging Results     No orders to display       Administered Medications     Medication Administration from 04/23/2018 1719 to 04/24/2018 2133     None          Procedures   Procedures    MDM     SMOKING CESSATION  I spent >3 minutes discussing with the patient regarding smoking cessation.  I strongly recommended to the patient to stop smoking.  It is the single most important thing the patient can do to improve their health.  Patient states they are willing to quit and will try the following strategy:  These are the strategies (plans) that are available to assist you to stop smoking or using other forms of tobacco or nicotine: Nicotine gum, nicotine inhaler or ask primary care doctor.  Please see attached carenote for further detail       Patient progress: improved 47 year old male presenting with facial pain and congestion consistent with sinusitis therefore will cover with antibiotics.  No evidence of pharyngitis or pneumonia.  Patient well-appearing tolerating p.o. Therefore outpatient antibiotics with close follow-up.    I have reviewed the patient's vital signs and nursing notes and any labs or imaging studies that were performed in the ER. I had a detailed discussion with the patient regarding the historical points, exam findings and any diagnostic results supporting the discharge diagnosis. I also discussed the need for outpatient follow up and the need to return to the ED if symptoms worsen or if there are any questions or concerns that arise at home.  The patient is well appearing, tolerating PO's and will follow up with PMD.    Consults: N/A     Clinical Impression     1. Acute frontal sinusitis, recurrence not specified        Prescriptions     Discharge Medication List as of 04/23/2018  5:44 PM      START taking these medications    Details   amoxicillin-clavulanate 875-125 mg tablet Take 1 tablet by mouth every twelve (12) hours for 10 days., Starting Sat 04/23/2018, Until Tue 05/03/2018, Print      ibuprofen 600 mg tablet Take 1 tablet (600 mg total) by mouth every six (6) hours as needed., Starting Sat 04/23/2018, Print             Disposition and Follow-up   Disposition: Discharge [1]    No future appointments.    Follow up with:  Clinic, Lavina Hamman, MD  [address]  [place] [address]  [phone]    In 2 days  for further evaluation and treatment      Return precautions are specified on After Visit Summary.      The documentation on this chart was performed by Debarah Crape, scribed for Tera Mater., MD    04/23/2018 5:44 PM     All scribe entries and documentation made by the scribe were entered at my direction.  I have reviewed this medical record and agree to the accuracy and completeness of the content entered by the scribe.  The documentation recorded by the scribe accurately reflects the service I personally performed and the decisions made by me.    Tera Mater., MD 9:33 PM 04/24/2018  Tera Mater., MD  04/24/18 2134

## 2018-04-24 NOTE — ED Notes
Pt ambulatory to ED c/o pressure-like pain on R side of face localized to the eyebrow x3 days. Pt reports productive cough for 2 weeks, denies fever. Pt denies cp, sob.

## 2018-06-18 ENCOUNTER — Ambulatory Visit: Payer: PRIVATE HEALTH INSURANCE

## 2018-06-18 ENCOUNTER — Inpatient Hospital Stay
Admit: 2018-06-18 | Discharge: 2018-06-18 | Disposition: A | Discharge status: Home / Self Care | Payer: PRIVATE HEALTH INSURANCE | Source: Home / Self Care

## 2018-06-18 DIAGNOSIS — R600 Localized edema: Secondary | ICD-10-CM

## 2018-06-18 LAB — Basic Metabolic Panel: POTASSIUM: 4.6 mmol/L (ref 3.6–5.3)

## 2018-06-18 LAB — Extra Light Green Top

## 2018-06-18 LAB — APTT: APTT: 24.8 s (ref 24.4–36.2)

## 2018-06-18 LAB — Lipase: LIPASE: 11 U/L (ref 9–63)

## 2018-06-18 LAB — B-Type Natriuretic Peptide: BNP: 24 pg/mL (ref <100)

## 2018-06-18 LAB — Alanine Aminotransferase: ALANINE AMINOTRANSFERASE: 30 U/L (ref 8–64)

## 2018-06-18 LAB — Alkaline Phosphatase: ALKALINE PHOSPHATASE: 62 U/L (ref 37–113)

## 2018-06-18 LAB — Prothrombin Time Panel: INR: 1 s (ref 11.5–14.4)

## 2018-06-18 LAB — Albumin: ALBUMIN: 3.5 g/dL — ABNORMAL LOW (ref 3.9–5.0)

## 2018-06-18 LAB — Amylase: AMYLASE: 39 U/L (ref 31–124)

## 2018-06-18 LAB — Bilirubin,Conjugated: BILIRUBIN,CONJUGATED: <0.2 mg/dL (ref <=0.3)

## 2018-06-18 LAB — Differential Automated: BASOPHIL PERCENT, AUTO: 1.8 (ref 0.00–0.50)

## 2018-06-18 LAB — CBC: ABSOLUTE NUCLEATED RBC COUNT: 0 10*3/uL (ref 0.00–0.00)

## 2018-06-18 LAB — Aspartate Aminotransferase: ASPARTATE AMINOTRANSFERASE: 27 U/L (ref 13–47)

## 2018-06-18 NOTE — ED Notes
Labs paged again for bloodwork as pt hard stick

## 2018-06-18 NOTE — ED Notes
Labs at bedside for bloodwork

## 2018-06-18 NOTE — ED Notes
Patient transported to u/s °

## 2018-06-18 NOTE — ED Provider Notes
[facility]  Emergency Department Service Report    Kenneth Burns 47 y.o. male , presents with Leg Swelling      Triage   Arrived on 06/18/2018 at 1:16 AM   Arrived by Walk-in [14]    ED Triage Vitals   Temp Temp Source BP Heart Rate Resp SpO2 O2 Device Pain Score Weight   06/18/18 0122 06/18/18 0122 06/18/18 0122 06/18/18 0122 06/18/18 0122 06/18/18 0122 06/18/18 0122 06/18/18 0125 06/18/18 0126   36.5 ???C (97.7 ???F) Oral 154/114 89 18 100 % None (Room air) Seven (!) 116 kg (255 lb 11.7 oz)       Pre hospital care:       Allergies   Allergen Reactions   ??? Erythromycin Anaphylaxis   ??? Morphine Rash       History   Patient is a 47 y.o. male who presents to the ED with complaint of bilateral leg swelling x 5 days. Sx began gradually, is constant, moderate and unchanged; it is non-radiating, and without alleviating factors.  Patient denies any chest pain or SOB.     The history is provided by the patient. No language interpreter was used.   Leg Pain    The incident occurred more than 2 days ago. The incident occurred at home. There was no injury mechanism. Pain location: bilateral legs. The pain is moderate. The pain has been constant since onset.            Past Medical History:   Diagnosis Date   ??? Kidney stone         Past Surgical History:   Procedure Laterality Date   ??? HERNIA REPAIR          Past Family History   Family history reviewed by me and there is no pertinent past family history related to patient's current case and/or care.      Past Social History   he reports that he has been smoking cigarettes. He has been smoking about 1.00 pack per day. He has never used smokeless tobacco. He reports current alcohol use. He reports being sexually active and has had partner(s) who are Male. He reports that he does not use drugs.     Review of Systems   Respiratory: Negative for shortness of breath.    Cardiovascular: Positive for leg swelling. Negative for chest pain. All other systems reviewed and are negative.      Physical Exam   Physical Exam  Vitals signs and nursing note reviewed.   Constitutional:       General: He is not in acute distress.     Appearance: He is well-developed.   HENT:      Head: Normocephalic and atraumatic.   Eyes:      Conjunctiva/sclera: Conjunctivae normal.   Neck:      Musculoskeletal: Normal range of motion and neck supple.   Cardiovascular:      Rate and Rhythm: Normal rate and regular rhythm.   Pulmonary:      Effort: Pulmonary effort is normal. No respiratory distress.      Breath sounds: Normal breath sounds.   Abdominal:      Palpations: Abdomen is soft.      Tenderness: There is no abdominal tenderness. There is no guarding or rebound.   Musculoskeletal: Normal range of motion.         General: Swelling (bilaterally, right greater than left) present.   Lymphadenopathy:      Cervical: No  cervical adenopathy.   Skin:     General: Skin is warm and dry.   Neurological:      Mental Status: He is alert.      Sensory: No sensory deficit.      Comments: Moving all four extremities.   Psychiatric:         Behavior: Behavior normal.         ED Course          Laboratory Results     Labs Reviewed   BASIC METABOLIC PANEL - Abnormal; Notable for the following components:       Result Value    Glucose 102 (*)     All other components within normal limits   ALBUMIN - Abnormal; Notable for the following components:    Albumin 3.5 (*)     All other components within normal limits   CBC (PERFORMABLE) - Abnormal; Notable for the following components:    Red Blood Cell Count 4.16 (*)     Hemoglobin 13.3 (*)     All other components within normal limits   DIFFERENTIAL, AUTOMATED (PERFORMABLE) - Abnormal; Notable for the following components:    Absolute Eos Count 1.30 (*)     Absolute Baso Count 0.12 (*)     Absolute Immature Gran Count 0.06 (*)     All other components within normal limits   ALT (SGPT) - Normal   AST (SGOT) - Normal   BILIRUBIN,CONJ - Normal ALKALINE PHOSPHATASE - Normal   LIPASE - Normal   AMYLASE - Normal   PROTHROMBIN TIME PANEL - Normal   APTT - Normal   BNP - Normal   CBC & AUTO DIFFERENTIAL    Narrative:     The following orders were created for panel order CBC & Plt & Diff.  Procedure                               Abnormality         Status                     ---------                               -----------         ------                     AVW[[id number]]                          Abnormal            Final result               Differential, Automated[[id number]]      Abnormal            Final result                 Please view results for these tests on the individual orders.   RAINBOW DRAW TO LABORATORY    Narrative:     The following orders were created for panel order Rainbow Draw to Laboratory Clinton Gallant).  Procedure                               Abnormality  Status                     ---------                               -----------         ------                     Extra Burna Mortimer ZOX[[id number]]                            In process                   Please view results for these tests on the individual orders.   EXTRA LIGHT GREEN TOP       Imaging Results     US duplex lower extremity veins bilat    by Hulan Saas (11/16 0303)          Administered Medications     Medication Administration from 06/18/2018 0117 to 06/18/2018 0537     None          Procedures   Procedures    MDM   Amount and/or Complexity of Data Reviewed   Clinical lab tests: reviewed and ordered  Tests in the radiology section of CPT???: ordered and reviewed  Tests in the medicine section of CPT???: ordered and reviewed  Decide to obtain previous medical records or to obtain history from someone other than the patient: yes  Review and summarize past medical records: yes  Independent visualization of images, tracings, or specimens: yes     ED Course:  Nursing note reviewed.  Previous medical records were obtained and reviewed by myself. 0217: I visited the patient to obtain history and perform the physical exam. IV access was secured.      Medical Decision Making:  Tremont Gavitt presents to the ED today with chief complaint of bilateral leg swelling and pain.    U/S was ordered to rule out clotting, I ordered a CBC on this patient to assess for acute anemia and to determine the WBC as possible evidence of acute bacterial infection.   I ordered a basic metabolic panel on this patient to assess for hyperglycemia, acute renal failure, e/o dehydration, and to assess for possible other metabolic abnormalities such as hyponatremia, hyperkalemia, amongst others. I assessed for BNP, Bilirubin, Albumin amongst others.   Normal BNP noted; venous duplex ordered and shows no e/o acute DVT; normal serum WBC noted; less likely acute cellulitis; Patient is otherwise well appearing; feel it is reasonable to discharge the patient home at this time with close outpatient follow up given and strict return precautions provided.     Clinical Impression     1. Pedal edema        Prescriptions     New Prescriptions    No medications on file       Disposition and Follow-up   Disposition: Discharge [1]    No future appointments.    Follow up with:  Clinic, Lavina Hamman, MD  [address]  [place] [address]  [phone]    In 1 week        Return precautions are specified on After Visit Summary.    The documentation on this chart was performed by Tomasita Crumble, scribed for Pablo Ledger, MD    06/18/2018 3:07  AM     All scribe entries and documentation made by the scribe were entered at my direction.  I have reviewed this medical record and agree to the accuracy and completeness of the content entered by the scribe.  The documentation recorded by the scribe accurately reflects the service I personally performed and the decisions made by me.    Rielle Schlauch 5:37 AM                   Pablo Ledger, MD  06/18/18 [phone]

## 2018-07-27 ENCOUNTER — Ambulatory Visit: Payer: PRIVATE HEALTH INSURANCE

## 2018-07-27 DIAGNOSIS — K2900 Acute gastritis without bleeding: Secondary | ICD-10-CM

## 2018-07-27 DIAGNOSIS — M549 Dorsalgia, unspecified: Secondary | ICD-10-CM

## 2018-07-28 ENCOUNTER — Inpatient Hospital Stay
Admit: 2018-07-28 | Discharge: 2018-07-28 | Disposition: A | Discharge status: Home / Self Care | Payer: PRIVATE HEALTH INSURANCE | Source: Home / Self Care

## 2018-07-28 MED ORDER — LANSOPRAZOLE 30 MG PO CPDR
30 mg | ORAL_CAPSULE | Freq: Every day | ORAL | 0 refills | Status: AC
Start: 2018-07-28 — End: ?

## 2018-07-28 MED ADMIN — ALUM & MAG HYDROXIDE-SIMETH 400-400-40 MG/5ML PO SUSP: 30 mL | ORAL | @ 07:00:00 | Stop: 2018-07-28 | NDC 00121176230

## 2018-07-28 MED ADMIN — PANTOPRAZOLE SODIUM 40 MG PO TBEC: 40 mg | ORAL | @ 07:00:00 | Stop: 2018-07-28 | NDC 68084081309

## 2018-07-28 MED ADMIN — LIDOCAINE VISCOUS HCL 2 % MT SOLN: 15 mL | ORAL | @ 07:00:00 | Stop: 2018-07-28 | NDC 50383077517

## 2018-07-28 NOTE — ED Provider Notes
[facility]  Emergency Department Service Report    Kenneth Burns 47 y.o. male , presents with Chest Pain      Triage   Arrived on 07/27/2018 at 10:39 PM   Arrived by Car [5]    ED Triage Vitals   Temp Temp Source BP Heart Rate Resp SpO2 O2 Device Pain Score Weight   07/27/18 2248 07/27/18 2248 07/27/18 2248 07/27/18 2248 07/27/18 2248 07/27/18 2248 07/27/18 2248 -- 07/27/18 2245   36.3 ???C (97.4 ???F) Oral 164/90 (!) 100 20 99 % None (Room air)  (!) 115.7 kg (255 lb)       Pre hospital care:       Allergies   Allergen Reactions   ??? Erythromycin Anaphylaxis   ??? Morphine Rash       History   Patient is a 47 y.o. male with hx of HLD, HTN who presents to the ED with complaint of gradual onset epigastric abdominal pain starting a few days ago. Pain is mild, burning, constant, unchanging, radiating to the left forearm and back, no alleviating or exacerbating factors. Associated with nausea. Denies chest pain.     The history is provided by the patient. No language interpreter was used.   Abdominal Pain   The primary symptoms of the illness include abdominal pain and nausea. The current episode started more than 2 days ago. The onset of the illness was gradual. The problem has not changed since onset.  The abdominal pain began more than 2 days ago. The pain came on gradually. The abdominal pain has been unchanged since its onset. The abdominal pain is located in the epigastric region. The abdominal pain does not radiate.            Past Medical History:   Diagnosis Date   ??? Hyperlipidemia    ??? Hypertension    ??? Kidney stone         Past Surgical History:   Procedure Laterality Date   ??? CHOLECYSTECTOMY     ??? HERNIA REPAIR     ??? KIDNEY STONE SURGERY          Past Family History   Family history reviewed by me and there is no pertinent past family history related to patient's current case and/or care.      Past Social History   he reports that he has been smoking cigarettes. He has been smoking about 1.00 pack per day. He has never used smokeless tobacco. He reports current alcohol use. He reports being sexually active and has had partner(s) who are Male. He reports that he does not use drugs.     Review of Systems   Gastrointestinal: Positive for abdominal pain and nausea.   Musculoskeletal: Positive for myalgias.   All other systems reviewed and are negative.      Physical Exam   Physical Exam  Vitals signs and nursing note reviewed.   Constitutional:       General: He is not in acute distress.     Appearance: He is well-developed.   HENT:      Head: Normocephalic and atraumatic.   Eyes:      Conjunctiva/sclera: Conjunctivae normal.   Neck:      Musculoskeletal: Normal range of motion and neck supple.   Cardiovascular:      Rate and Rhythm: Normal rate and regular rhythm.   Pulmonary:      Effort: Pulmonary effort is normal. No respiratory distress.  Breath sounds: Normal breath sounds.   Abdominal:      Palpations: Abdomen is soft.      Tenderness: There is no abdominal tenderness. There is no guarding or rebound.   Musculoskeletal: Normal range of motion.   Lymphadenopathy:      Cervical: No cervical adenopathy.   Skin:     General: Skin is warm and dry.   Neurological:      Mental Status: He is alert.      Sensory: No sensory deficit.      Comments: Moving all four extremities.   Psychiatric:         Behavior: Behavior normal.         ED Course          Laboratory Results   Labs Reviewed - No data to display    Imaging Results     No orders to display       Administered Medications     Medication Administration from 07/27/2018 2239 to 07/27/2018 2323     None          Procedures   ED ECG interpretation  Date/Time: 07/27/2018 10:54 PM  Performed by: Tera Mater., MD  Authorized by: Tera Mater., MD     ECG reviewed by ED Physician in the absence of a cardiologist: yes    Previous ECG:     Previous ECG:  Unavailable  Interpretation:     Interpretation: normal    Rate:     ECG rate:  91 ECG rate assessment: normal    Rhythm:     Rhythm: sinus rhythm    Ectopy:     Ectopy: none    QRS:     QRS axis:  Normal    QRS intervals:  Normal  Conduction:     Conduction: normal    ST segments:     ST segments:  Normal  T waves:     T waves: normal        SMOKING CESSATION  I spent >3 minutes discussing with the patient regarding smoking cessation.  I strongly recommended to the patient to stop smoking.  It is the single most important thing the patient can do to improve their health.  Patient states they are willing to quit and will try the following strategy:  These are the strategies (plans) that are available to assist you to stop smoking or using other forms of tobacco or nicotine: Nicotine gum, nicotine inhaler or ask primary care doctor.  Please see attached carenote for further detail   MDM  Number of Diagnoses or Management Options     Amount and/or Complexity of Data Reviewed  Tests in the radiology section of CPT???: reviewed and ordered  Decide to obtain previous medical records or to obtain history from someone other than the patient: yes  Review and summarize past medical records: yes  Independent visualization of images, tracings, or specimens: yes        Patient progress: improved    47 year old male presenting with heartburn which improved with GI cocktail therefore will start on Prevacid.  No abdominal tenderness to suggest pancreatitis, cholecystitis, hepatitis.  No evidence of ischemia on EKG.  No evidence of pneumothorax, pleural effusion, mass or other pathology on chest x-ray.  Close follow up with PCP should symptoms persist.    I have reviewed the patient's vital signs and nursing notes and any labs or imaging studies that were performed in the ER. I had a  detailed discussion with the patient regarding the historical points, exam findings and any diagnostic results supporting the discharge diagnosis. I also discussed the need for outpatient follow up and the need to return to the ED if symptoms worsen or if there are any questions or concerns that arise at home.  The patient is well appearing, tolerating PO's and will follow up with PMD.    Consults: N/A    Clinical Impression     1. Other acute back pain    2. Other acute gastritis without hemorrhage        Prescriptions     New Prescriptions    LANSOPRAZOLE 30 MG DR CAPSULE    Take 1 capsule (30 mg total) by mouth daily.       Disposition and Follow-up   Disposition: Discharge [1]    No future appointments.    Follow up with:  Clinic, Lavina Hamman, MD  [address]  [place] [address]  [phone]    In 2 days  for further evaluation and treatment      Return precautions are specified on After Visit Summary.    The documentation on this chart was performed by [place] Molina, scribed for Tera Mater., MD    07/27/2018 11:06 PM     All scribe entries and documentation made by the scribe were entered at my direction.  I have reviewed this medical record and agree to the accuracy and completeness of the content entered by the scribe.  The documentation recorded by the scribe accurately reflects the service I personally performed and the decisions made by me.    Tera Mater., MD 11:23 PM 07/27/2018               Tera Mater., MD  07/27/18 8416       Tera Mater., MD  07/27/18 [phone]

## 2018-07-28 NOTE — Consults
SW received referral from RN for resources/support for current homeless status. Pt has no other notes for review. Pt is a 47 yo male who presents to [facility] ED d/t chest pain.    Sw met with pt at bedside, introduced himself and reason for visit. Pt politely declined assessment, stating that he is well connected ([place]) who have gotten him and his wife a section 8 voucher and they are in the process of looking for housing. Pt is currently in his car. Pt plans to return back once medically cleared. Pt declined other concerns. Pt has weather appropriate clothing. Pt does not require transportation assistance.

## 2019-12-15 ENCOUNTER — Ambulatory Visit: Payer: PRIVATE HEALTH INSURANCE

## 2020-01-12 ENCOUNTER — Ambulatory Visit: Payer: PRIVATE HEALTH INSURANCE

## 2020-01-12 DIAGNOSIS — E785 Hyperlipidemia, unspecified: Secondary | ICD-10-CM

## 2020-01-12 DIAGNOSIS — R0600 Dyspnea, unspecified: Secondary | ICD-10-CM

## 2020-01-12 DIAGNOSIS — R0789 Other chest pain: Secondary | ICD-10-CM

## 2020-01-12 DIAGNOSIS — I10 Essential (primary) hypertension: Secondary | ICD-10-CM

## 2020-01-12 NOTE — Consults
CARDIOLOGY NOTE      PATIENT: Kenneth Burns   MRN: [id number]   DOB: 10-23-1970   DATE OF SERVICE: 01/12/2020    PCP:  Clinic, Lavina Hamman, MD      Problem List Items Addressed This Visit     None      Visit Diagnoses     Essential hypertension    -  Primary    Dyslipidemia        DOE (dyspnea on exertion)        Chest discomfort                HPI:     This is a 49 y.o. male who is here for cardiac evaluation.  Indication:  Palpitations.     Pt with palpitations, and irregular heart beats   - lasting 10 minutes, improved with taking Xanax    He reports SOB/dyspnea   - it occurs with panic attack and exertion, x 6-7 months    Periodic chest pain, atypical    Pt reports doing a stress test in the past, which was pharmacologic. It precipitated a panic attack.   He is now declining stress test, exercise or pharmacologic.   He is requesting an angiogram.     ET: ''I can walk''    Review of system: 14 point review of system is otherwise unremarkable    Past Medical History:   Diagnosis Date   ??? Hyperlipidemia    ??? Hypertension    ??? Kidney stone        Past Surgical History:   Procedure Laterality Date   ??? CHOLECYSTECTOMY     ??? HERNIA REPAIR     ??? KIDNEY STONE SURGERY         Allergies:   Allergies   Allergen Reactions   ??? Erythromycin Anaphylaxis   ??? Morphine Rash       Social History     Socioeconomic History   ??? Marital status: Married     Spouse name: Not on file   ??? Number of children: Not on file   ??? Years of education: Not on file   ??? Highest education level: Not on file   Occupational History   ??? Not on file   Social Needs   ??? Financial resource strain: Not on file   ??? Food insecurity     Worry: Not on file     Inability: Not on file   ??? Transportation needs     Medical: Not on file     Non-medical: Not on file   Tobacco Use   ??? Smoking status: Current Every Day Smoker     Packs/day: 1.00     Types: Cigarettes   ??? Smokeless tobacco: Never Used   Substance and Sexual Activity   ??? Alcohol use: Yes   ??? Drug use: No ??? Sexual activity: Yes     Partners: Female   Lifestyle   ??? Physical activity     Days per week: Not on file     Minutes per session: Not on file   ??? Stress: Not on file   Relationships   ??? Social Wellsite geologist on phone: Not on file     Gets together: Not on file     Attends religious service: Not on file     Active member of club or organization: Not on file     Attends meetings of clubs or organizations: Not on file  Relationship status: Not on file   Other Topics Concern   ??? Not on file   Social History Narrative   ??? Not on file       FH:   Brother with MI 65s, father with MI in his 15s  Multiple brothers with MI  Cousins with MI    Objective:     BP 147/97  ~ Pulse 96  ~ Temp 36.8 ???C (98.3 ???F)  ~ Wt (!) 251 lb (113.9 kg)  ~ SpO2 98%     Vitals:    01/12/20 0923   BP: 147/97   Pulse: 96   Temp: 36.8 ???C (98.3 ???F)   SpO2: 98%   Weight: (!) 251 lb (113.9 kg)       There is no height or weight on file to calculate BMI.    Wt Readings from Last 3 Encounters:   01/12/20 (!) 251 lb (113.9 kg)   07/27/18 (!) 255 lb (115.7 kg)   06/18/18 (!) 255 lb 11.7 oz (116 kg)          Physical Exam:  General Exam: The patient is well-developed, well-nourished and in no apparent distress.   Neurologic/Psychiatric: The patient is alert and oriented. Patient's affect is appropriate.  HEENT: Head is normocephalic, atraumatic. Neck is supple with full range of motion. JVP is flat. Sclerae are anicteric.  Oral mucosa is clear. There is no epistaxis.  Cardiac: PMI is not displaced on palpation. Heart is regular in rate and rhythm. There is no murmur. There are no rubs, gallops or thrills.  Vascular: There are no carotid bruits. Radial pulses are 2+ and symmetric.    Respiratory: Breathing is non-labored. There is no use of accessory muscles.  Lungs are clear to auscultation bilaterally without wheeze, crackles, or rhonchi.  Abdomen: Bowel sounds are present. Abdomen is soft, nontender and nondistended. There is no hepatomegaly appreciated. No pulsatile mass.  Extremities: No edema. There is no cyanosis or clubbing.  Integument: There is no visible petechiae. + tattoos   Back: No costovertebral angle tenderness.      Laboratory:  Lab Results   Component Value Date    NA 141 06/18/2018    K 4.6 06/18/2018    CL 103 06/18/2018    CO2 28 06/18/2018    BUN 17 06/18/2018    CREAT 0.93 06/18/2018    GLUCOSE 102 (H) 06/18/2018    CALCIUM 8.8 06/18/2018     Lab Results   Component Value Date    TROPONIN <0.04 02/18/2018    TROPONIN <0.04 02/18/2018     Lab Results   Component Value Date    BNP 24 06/18/2018     Lab Results   Component Value Date    WBC 6.63 06/18/2018    HGB 13.3 (L) 06/18/2018    HCT 38.7 06/18/2018    MCV 93.0 06/18/2018    PLT 226 06/18/2018     No results found for: CHOL, CHOLHDL, CHOLDLCAL, CHOLDLQ, TRIGLY  Lab Results   Component Value Date    PT 12.7 06/18/2018    INR 1.0 06/18/2018     No components found for: HA1C  No results found for: TSH    There is no problem list on file for this patient.      Outpatient Medications Prior to Visit   Medication Sig   ??? aspirin 81 mg EC tablet Take 81 mg by mouth daily.   ??? Atorvastatin Calcium (LIPITOR PO) Take by mouth.   ???  HYDROXYZINE HCL PO Take by mouth.   ??? ibuprofen 600 mg tablet Take 1 tablet (600 mg total) by mouth every six (6) hours as needed.   ??? lansoprazole 30 mg DR capsule Take 1 capsule (30 mg total) by mouth daily.   ??? LISINOPRIL PO Take by mouth.   ??? METHADONE HCL PO Take by mouth.     No facility-administered medications prior to visit.        Current Outpatient Medications   Medication Sig   ??? aspirin 81 mg EC tablet Take 81 mg by mouth daily.   ??? Atorvastatin Calcium (LIPITOR PO) Take by mouth.   ??? HYDROXYZINE HCL PO Take by mouth.   ??? ibuprofen 600 mg tablet Take 1 tablet (600 mg total) by mouth every six (6) hours as needed.   ??? lansoprazole 30 mg DR capsule Take 1 capsule (30 mg total) by mouth daily.   ??? LISINOPRIL PO Take by mouth.   ??? METHADONE HCL PO Take by mouth.     No current facility-administered medications for this visit.              2018 ACC/AHA guidelines recommends to continue statin treatment, as indicated based on pre-treatment risk assessment.    10-year ASCVD risk  cannot be calculated because at least one required variable is not available in [facility]. Continue moderate or high-intensity statin therapy. Monitor adherence. as of 10:06 AM on 01/12/2020  10-year ASCVD risk with optimal risk factors is 1.7%.  Values used to calculate ASCVD score:  Age: 49 y.o.   Gender: Male Race: Not African American.  Cannot calculate risk because HDL cholesterol not documented within the past 5 years.    Cannot calculate risk because Total cholesterol not documented within the past 5 years.    LDL cholesterol not documented within the past 5 years.    Systolic BP: 147 mm Hg. BP was measured today.  The patient is being treated with a medication that influences SBP.  The patient is currently a smoker.  The patient does not have a diagnosis of diabetes.    Click here for the [facility] ASCVD Cardiovascular Risk Estimator Plus tool Office manager).      Assessment:     Ayron Fillinger is 49 y.o. male with    CAD, per pt pt has an angiogram at age of 41, and had ''40% blockage''  HTN  Dyslipidemia   - Lipitor caused cramps  Tobacco use  History of bradycardia (occurred nocturnally)  Sleep apnea; not using CPAP  FH of premature CAD  Panic attacks  Anxiety   There is no height or weight on file to calculate BMI.       Plan:     ASA   He just started Lopid 3 days ago  For BP, he has been taking Lisinopril/HCTZ 20/25  Tobacco cessation, discussed  Obtain labs from PCP  He declined stress test. Ordered coronary CTA  Echo  Zio patch  Labs     Orders Placed This Encounter   ??? CT coronary angiogram wo+w contrast   ??? Lipid Panel   ??? Chol,LDL,Quant   ??? ALT (SGPT)   ??? Potassium   ??? eGFR by Creatinine   ??? CK, Total   ??? BNP   ??? Hgb A1c   ??? Glucose   ??? ZIOPATCH - EXTENDED HOLTER MONITOR ??? ECG 12-Lead [facility] Performed   ??? Echo adult transthoracic complete  Return in about 4 weeks (around 02/09/2020).      Patient Instructions     How to Quit Smoking  Smoking is a hard habit to break. About 50% of all???people who have ever smoked have been able to quit. Most people???who still smoke want to quit. Here are some of the best ways to stop smoking.     Keep in mind the health benefits of quitting  The health benefits of quitting start right away. They keep improving the longer you go without smoking. Knowing this can help inspire you to stay on track. These benefits occur at any age. If you are 17 or 70, quitting is a good choice. Some of the health benefits after your last cigarette include:   ??? After 20 minutes: Your blood pressure and pulse return to normal.  ??? After 8 hours: Your oxygen levels return to normal.  ??? After 2 days: Your ability to smell and taste start to improve as damaged nerves regrow.  ??? After 2 to 3 weeks: Your circulation and lung function improve.  ??? After 1 to 9 months: Your coughing, congestion, and shortness of breath decrease. Your tiredness decreases.  ??? After 1 year: Your risk of heart attack decreases by 50%.  ??? After 5 years: Your risk of lung cancer decreases by 50%. Your risk of stroke becomes the same as a nonsmoker???s.  Go cold [place]  Most???former smokers quit cold [place]. This means stopping all at once. Trying to cut back slowly often doesn't work as well. This may be because it continues the habit of smoking. Also you may inhale more smoke while smoking fewer cigarettes. This leads to the same amount of nicotine in your body.   Get support  Support programs can be a big help, especially for heavy smokers. These groups offer lectures, ways to change behavior, and peer support. Here are some ways to find a support program:   ??? Free national quitline 800-QUIT-NOW 320-148-7353)  ??? Hospital quit-smoking programs  ??? American Lung Association (475)167-5846  ??? American Cancer Society 517-043-5386  Support at home is important too. Family and friends can offer praise and reassurance. If the smoker in your life finds it hard to quit, encourage them to keep trying.   Try over-the-counter medicine  Nicotine replacement therapy???may make it???easier to quit. Some aids are available without a prescription. These include a nicotine patch, gum, and lozenges. But it's best to use these under the care of your healthcare provider. The skin patch gives a steady supply of nicotine. Nicotine gum and lozenges give???short-time doses of low levels of nicotine. Both methods reduce the craving for cigarettes. If you???have nausea, vomiting, dizziness, weakness, or a fast heartbeat, stop using these products. See your provider.   Ask about prescription medicine  After reviewing???your smoking patterns and past attempts to quit, your healthcare provider may offer a prescription medicine such as bupropion, varenicline, a nicotine inhaler, or nasal spray. Each has advantages and side effects. Your provider can review these with you.   Keep trying  Most smokers make many attempts at quitting before they succeed. It???s important not to give up.   To learn more  For more on how to quit smoking, try these resources:???  ??? http://[place].com/ 800-QUIT-NOW 915-453-9007)  ??? www.smokefree.gov 877-44U-QUIT 331-641-2692)  ??? [employer] 800-LUNGUSA 931-789-9516)  StayWell last reviewed this educational content on 07/03/2018  ??? 2000-2020 [employer], [place]. All rights reserved. This information is not intended as a substitute for professional medical  care. Always follow your healthcare professional's instructions.            No future appointments.      Author: Clelia Croft, MD

## 2020-01-12 NOTE — Patient Instructions
How to Quit Smoking  Smoking is a hard habit to break. About 50% of all people who have ever smoked have been able to quit. Most people who still smoke want to quit. Here are some of the best ways to stop smoking.     Keep in mind the health benefits of quitting  The health benefits of quitting start right away. They keep improving the longer you go without smoking. Knowing this can help inspire you to stay on track. These benefits occur at any age. If you are 17 or 70, quitting is a good choice. Some of the health benefits after your last cigarette include:   After 20 minutes: Your blood pressure and pulse return to normal.  After 8 hours: Your oxygen levels return to normal.  After 2 days: Your ability to smell and taste start to improve as damaged nerves regrow.  After 2 to 3 weeks: Your circulation and lung function improve.  After 1 to 9 months: Your coughing, congestion, and shortness of breath decrease. Your tiredness decreases.  After 1 year: Your risk of heart attack decreases by 50%.  After 5 years: Your risk of lung cancer decreases by 50%. Your risk of stroke becomes the same as a nonsmoker’s.  Go cold turkey  Most former smokers quit cold turkey. This means stopping all at once. Trying to cut back slowly often doesn't work as well. This may be because it continues the habit of smoking. Also you may inhale more smoke while smoking fewer cigarettes. This leads to the same amount of nicotine in your body.   Get support  Support programs can be a big help, especially for heavy smokers. These groups offer lectures, ways to change behavior, and peer support. Here are some ways to find a support program:   Free national quitline 800-QUIT-NOW (800-784-8669)  Hospital quit-smoking programs  American Lung Association 800-586-4872  American Cancer Society 800-227-2345  Support at home is important too. Family and friends can offer praise and reassurance. If the smoker in your life finds it hard to quit,  encourage them to keep trying.   Try over-the-counter medicine  Nicotine replacement therapy may make it easier to quit. Some aids are available without a prescription. These include a nicotine patch, gum, and lozenges. But it's best to use these under the care of your healthcare provider. The skin patch gives a steady supply of nicotine. Nicotine gum and lozenges give short-time doses of low levels of nicotine. Both methods reduce the craving for cigarettes. If you have nausea, vomiting, dizziness, weakness, or a fast heartbeat, stop using these products. See your provider.   Ask about prescription medicine  After reviewing your smoking patterns and past attempts to quit, your healthcare provider may offer a prescription medicine such as bupropion, varenicline, a nicotine inhaler, or nasal spray. Each has advantages and side effects. Your provider can review these with you.   Keep trying  Most smokers make many attempts at quitting before they succeed. It’s important not to give up.   To learn more  For more on how to quit smoking, try these resources:   www.cdc.gov/tobacco/quit_smoking/ 800-QUIT-NOW (800-784-8669)  www.smokefree.gov 877-44U-QUIT (877-448-7848)  www.lung.org/stop-smoking/ 800-LUNGUSA (800-586-4872)  StayWell last reviewed this educational content on 07/03/2018  © 2000-2020 [employer]. All rights reserved. This information is not intended as a substitute for professional medical care. Always follow your healthcare professional's instructions.

## 2020-01-19 ENCOUNTER — Ambulatory Visit: Payer: PRIVATE HEALTH INSURANCE

## 2020-01-23 ENCOUNTER — Telehealth: Payer: PRIVATE HEALTH INSURANCE

## 2020-01-23 NOTE — Telephone Encounter
Spoke to patient and rescheduled appt

## 2020-01-23 NOTE — Telephone Encounter
Hello Kenneth Burns,    Patient of Dr.Tabibiazar , they are not cleared for monitor. Please schedule out and inform the patient. Thank you for your time.    Best regards,    Dorcas Mcmurray

## 2020-01-24 ENCOUNTER — Ambulatory Visit: Payer: PRIVATE HEALTH INSURANCE

## 2020-02-01 ENCOUNTER — Ambulatory Visit: Payer: PRIVATE HEALTH INSURANCE

## 2020-02-08 ENCOUNTER — Ambulatory Visit: Payer: PRIVATE HEALTH INSURANCE

## 2020-02-23 ENCOUNTER — Inpatient Hospital Stay: Payer: PRIVATE HEALTH INSURANCE

## 2020-03-01 ENCOUNTER — Ambulatory Visit: Payer: PRIVATE HEALTH INSURANCE
